# Patient Record
Sex: Male | Born: 1960 | Race: White | Hispanic: No | Marital: Married | Smoking: Never smoker
Health system: Southern US, Community
[De-identification: ages and names within clinical notes are randomized; demographics above are authoritative.]

## PROBLEM LIST (undated history)

## (undated) DIAGNOSIS — I10 Essential (primary) hypertension: Secondary | ICD-10-CM

---

## 1985-05-03 HISTORY — PX: MASTOIDECTOMY: SHX711

## 2009-03-04 ENCOUNTER — Ambulatory Visit: Payer: Self-pay | Admitting: Internal Medicine

## 2010-01-29 ENCOUNTER — Ambulatory Visit: Payer: Self-pay | Admitting: Internal Medicine

## 2010-03-06 ENCOUNTER — Ambulatory Visit: Payer: Self-pay | Admitting: Internal Medicine

## 2015-09-10 ENCOUNTER — Other Ambulatory Visit: Payer: Self-pay | Admitting: Unknown Physician Specialty

## 2015-09-10 DIAGNOSIS — H7291 Unspecified perforation of tympanic membrane, right ear: Secondary | ICD-10-CM

## 2015-09-24 ENCOUNTER — Ambulatory Visit
Admission: RE | Admit: 2015-09-24 | Discharge: 2015-09-24 | Disposition: A | Discharge status: Home / Self Care | Payer: Managed Care, Other (non HMO) | Source: Ambulatory Visit | Attending: Unknown Physician Specialty | Admitting: Unknown Physician Specialty

## 2015-09-24 DIAGNOSIS — H7291 Unspecified perforation of tympanic membrane, right ear: Secondary | ICD-10-CM | POA: Diagnosis not present

## 2015-09-24 DIAGNOSIS — Z9889 Other specified postprocedural states: Secondary | ICD-10-CM | POA: Insufficient documentation

## 2015-12-03 ENCOUNTER — Encounter: Payer: Self-pay | Admitting: *Deleted

## 2015-12-03 ENCOUNTER — Encounter: Payer: Self-pay | Admitting: Anesthesiology

## 2015-12-05 NOTE — Discharge Instructions (Signed)

## 2015-12-12 ENCOUNTER — Ambulatory Visit
Admission: RE | Admit: 2015-12-12 | Payer: Managed Care, Other (non HMO) | Source: Ambulatory Visit | Admitting: Unknown Physician Specialty

## 2015-12-12 HISTORY — DX: Essential (primary) hypertension: I10

## 2015-12-12 SURGERY — TYMPANOPLASTY
Anesthesia: General | Laterality: Right

## 2017-03-15 IMAGING — CT CT TEMPORAL BONES W/O CM
1 series · 15 of 30 positions shown, 19 images · non-contrast
Comparison: None.

CLINICAL DATA: Right tympanic membrane perforation. Right-sided
hearing loss and chronic ear infections. Remote history of left
temporal bones surgery.

EXAM:
CT TEMPORAL BONES WITHOUT CONTRAST
TECHNIQUE: Axial and coronal plane CT imaging of the petrous temporal bones was
performed with thin-collimation image reconstruction. No intravenous
contrast was administered. Multiplanar CT image reconstructions were
also generated.

[Series 3: ax soft · axial · 0.33mm/px · z∈[-98,-42]mm · 15 of 32 slices shown, 19 images]
[im 2/32  brain]
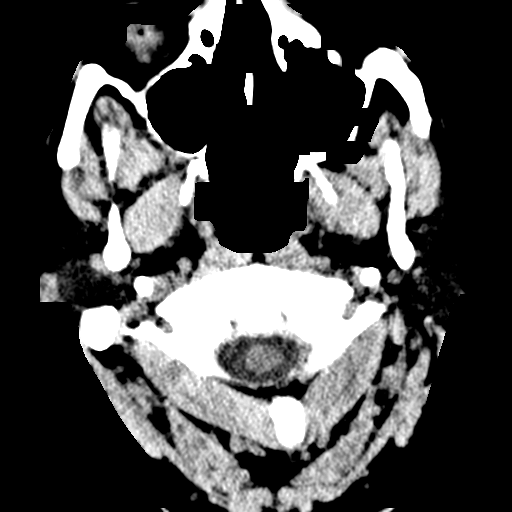
[im 2/32  bone]
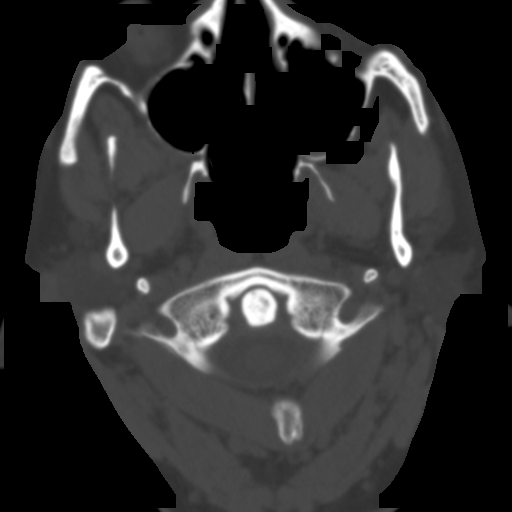
[im 4/32  bone]
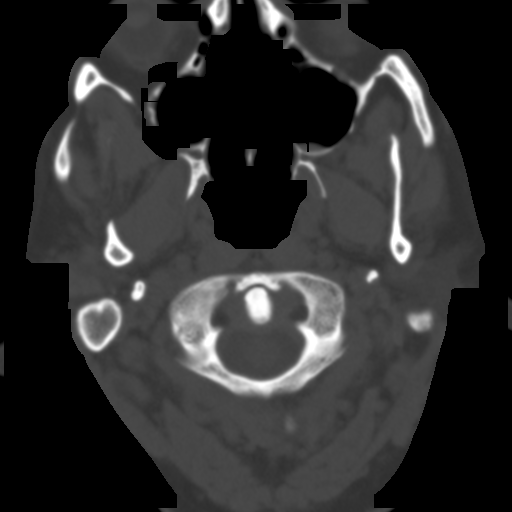
[im 6/32  bone]
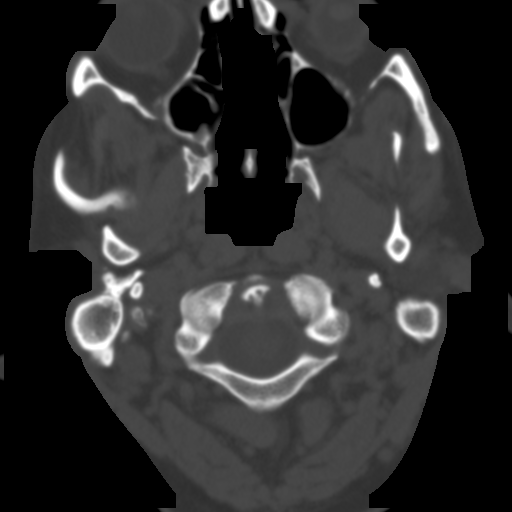
[im 8/32  bone]
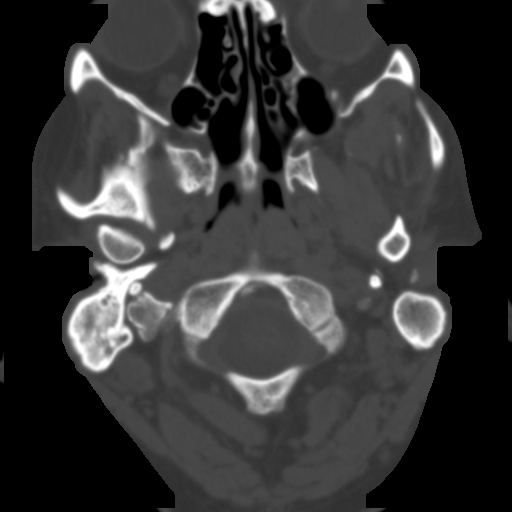
[im 10/32  brain]
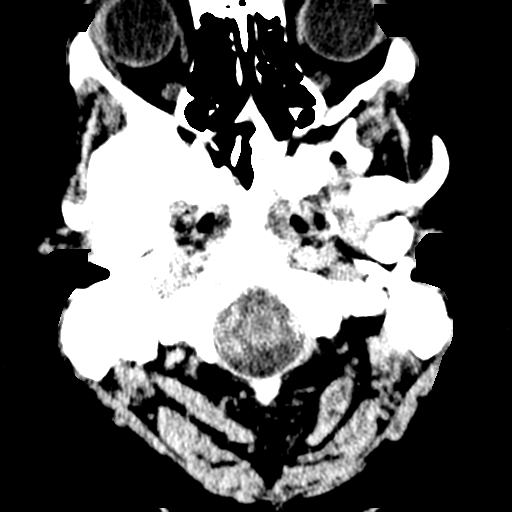
[im 10/32  bone]
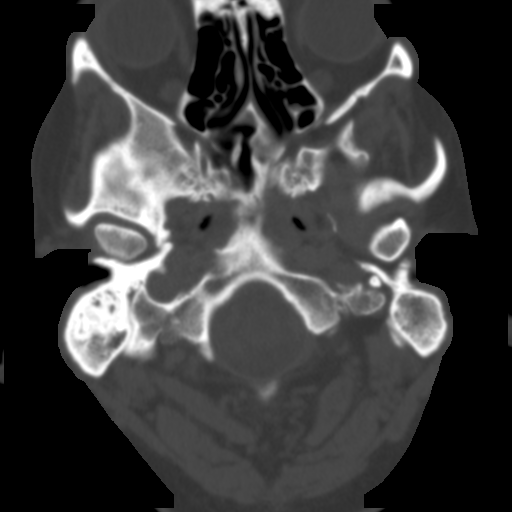
[im 12/32  bone]
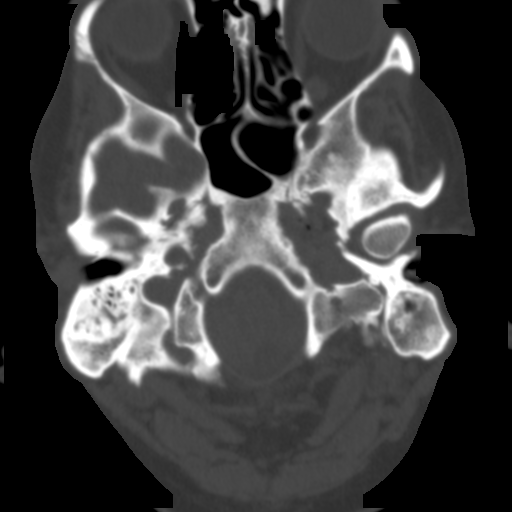
[im 14/32  bone]
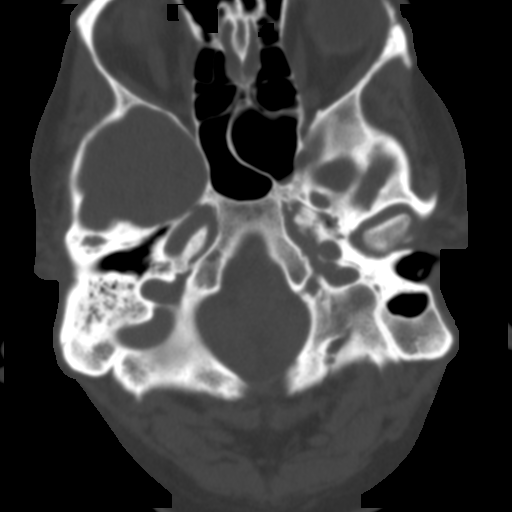
[im 17/32  bone]
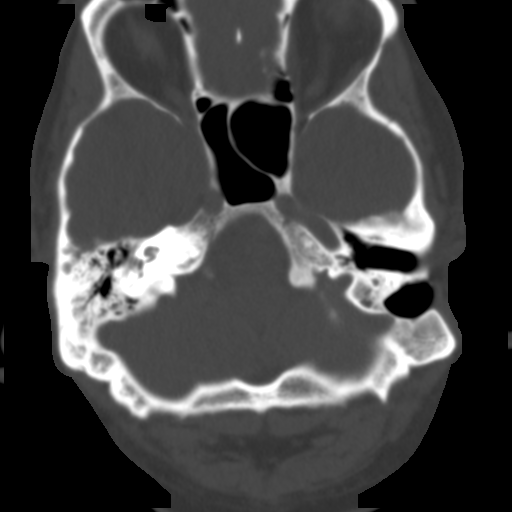
[im 18/32  brain]
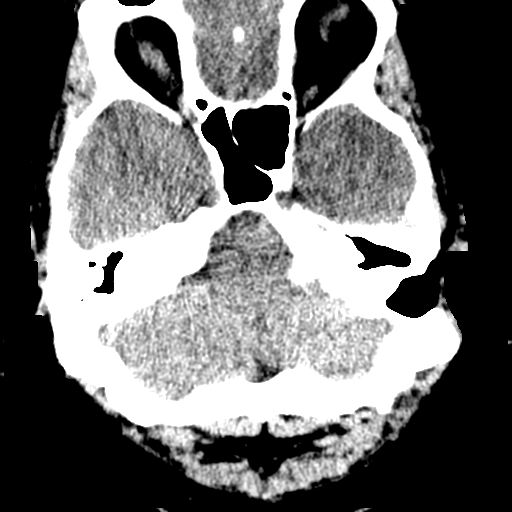
[im 18/32  bone]
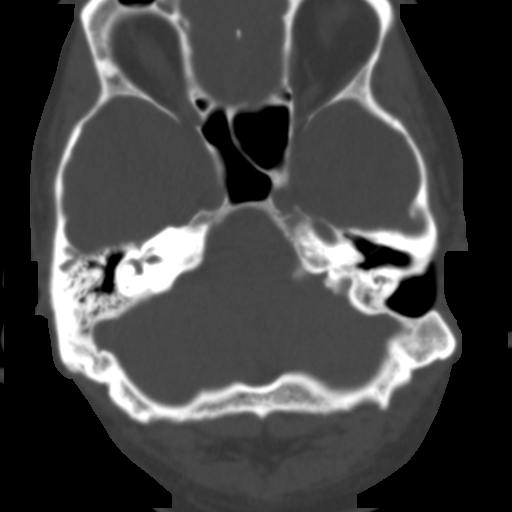
[im 20/32  bone]
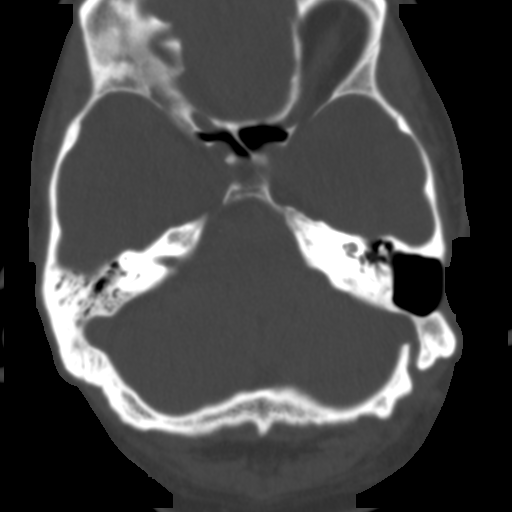
[im 22/32  bone]
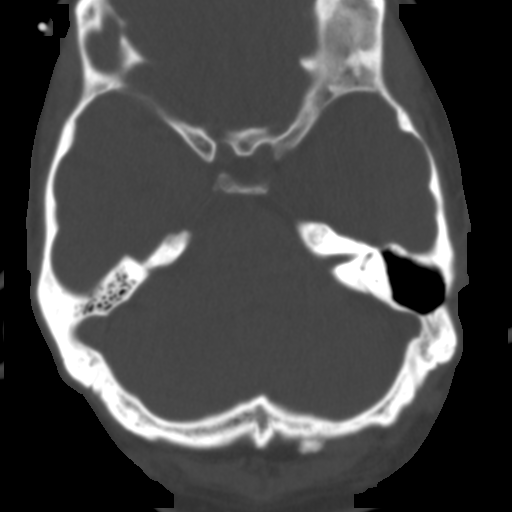
[im 24/32  bone]
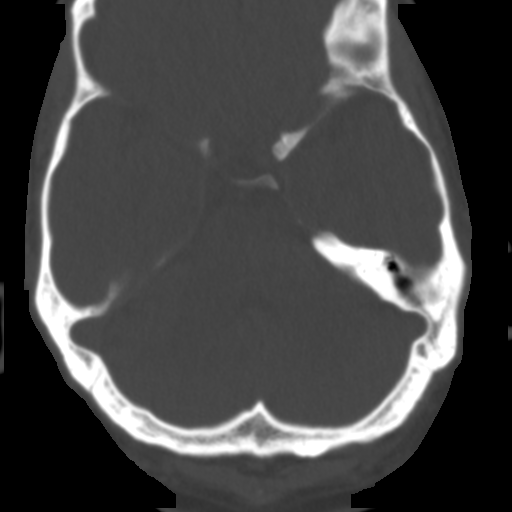
[im 26/32  brain]
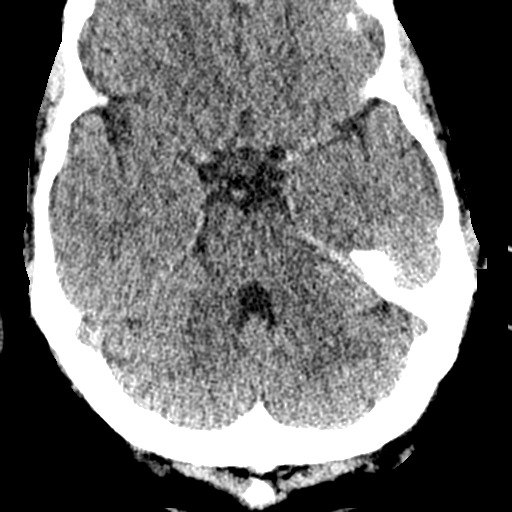
[im 26/32  bone]
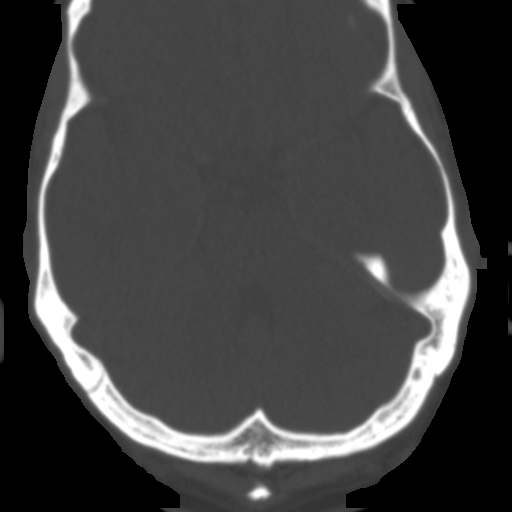
[im 28/32  bone]
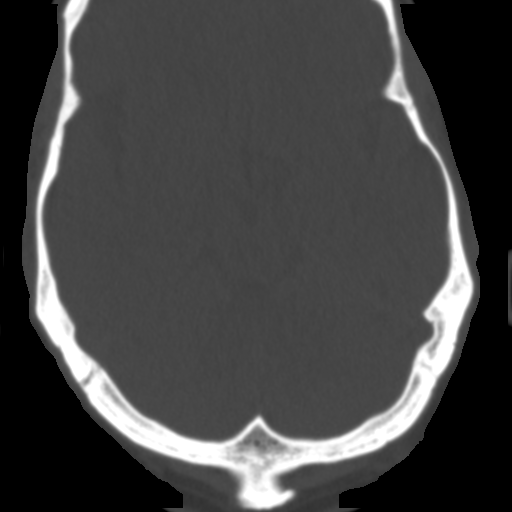
[im 30/32  bone]
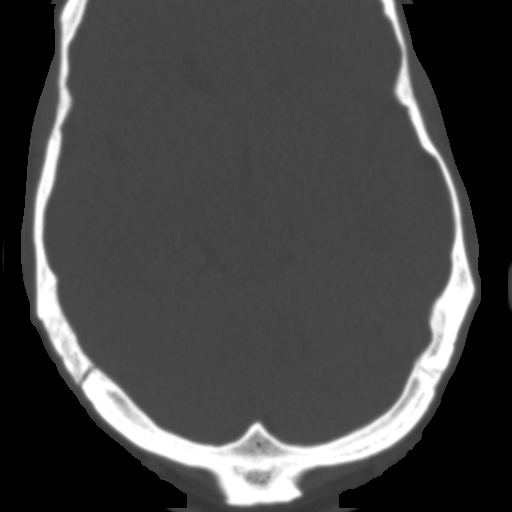

[15 of 30 positions shown; findings below may reference images not displayed]

FINDINGS: RIGHT: There is mild soft tissue thickening in the external auditory
canal without associated EAC bony erosion. There is thickening of
the superior portion of the tympanic membrane with the inferior
aspect not well visualized, and this may reflect the provided
history of perforation. A very small amount of non masslike soft
tissue is present in Prussak's space, and there is mild blunting of
the scutum. Soft tissue partially surrounds the ossicles without
evidence of ossicular erosion. The internal auditory canal, cochlea,
vestibule, and semicircular canals are unremarkable. There is
moderate mastoid air cell appearance with surrounding osseous
sclerosis suggestive of chronic infection/inflammation.

LEFT: Sequelae of prior canal wall up mastoidectomy are identified.
The mastoidectomy bowl is aerated and clear. A small amount of
debris is present in the external auditory canal. The ossicles
appear intact. The tympanic cavity is clear. The internal auditory
canal, cochlea, vestibule, and semicircular canals are unremarkable.

Mild left sphenoid and left maxillary sinus mucosal thickening is
partially visualized. The visualized portion of the brain is
unremarkable. Visualized orbits are unremarkable.
IMPRESSION: 1. Small volume soft tissue in the right middle ear with chronic
appearing mastoid air cell opacification. This may reflect the
sequelae of chronic infection, although a small, early cholesteatoma
is not excluded given mild blunting of the scutum. No ossicular
erosion.
2. Prior left mastoidectomy.
